# Patient Record
Sex: Female | Born: 1970 | Race: Black or African American | Hispanic: No | Marital: Married | Smoking: Never smoker
Health system: Southern US, Community
[De-identification: ages and names within clinical notes are randomized; demographics above are authoritative.]

## PROBLEM LIST (undated history)

## (undated) DIAGNOSIS — I10 Essential (primary) hypertension: Secondary | ICD-10-CM

## (undated) HISTORY — PX: OTHER SURGICAL HISTORY: SHX169

## (undated) HISTORY — PX: ABDOMINAL HYSTERECTOMY: SHX81

## (undated) HISTORY — PX: TONSILLECTOMY: SUR1361

---

## 2015-06-08 ENCOUNTER — Emergency Department
Admission: EM | Admit: 2015-06-08 | Discharge: 2015-06-08 | Disposition: A | Discharge status: Home / Self Care | Payer: BLUE CROSS/BLUE SHIELD | Attending: Emergency Medicine | Admitting: Emergency Medicine

## 2015-06-08 ENCOUNTER — Emergency Department: Payer: BLUE CROSS/BLUE SHIELD

## 2015-06-08 ENCOUNTER — Encounter: Payer: Self-pay | Admitting: Emergency Medicine

## 2015-06-08 DIAGNOSIS — I10 Essential (primary) hypertension: Secondary | ICD-10-CM | POA: Diagnosis not present

## 2015-06-08 DIAGNOSIS — E039 Hypothyroidism, unspecified: Secondary | ICD-10-CM | POA: Diagnosis not present

## 2015-06-08 DIAGNOSIS — F329 Major depressive disorder, single episode, unspecified: Secondary | ICD-10-CM | POA: Diagnosis not present

## 2015-06-08 DIAGNOSIS — R202 Paresthesia of skin: Secondary | ICD-10-CM | POA: Diagnosis not present

## 2015-06-08 DIAGNOSIS — R0602 Shortness of breath: Secondary | ICD-10-CM | POA: Diagnosis present

## 2015-06-08 DIAGNOSIS — G8929 Other chronic pain: Secondary | ICD-10-CM | POA: Diagnosis not present

## 2015-06-08 DIAGNOSIS — R059 Cough, unspecified: Secondary | ICD-10-CM

## 2015-06-08 DIAGNOSIS — R05 Cough: Secondary | ICD-10-CM | POA: Diagnosis not present

## 2015-06-08 HISTORY — DX: Essential (primary) hypertension: I10

## 2015-06-08 LAB — CBC WITH DIFFERENTIAL/PLATELET
BASOS PCT: 0 %
Basophils Absolute: 0 10*3/uL (ref 0–0.1)
EOS ABS: 0.3 10*3/uL (ref 0–0.7)
Eosinophils Relative: 5 %
HCT: 41.8 % (ref 35.0–47.0)
HEMOGLOBIN: 13.7 g/dL (ref 12.0–16.0)
Lymphocytes Relative: 36 %
Lymphs Abs: 2.3 10*3/uL (ref 1.0–3.6)
MCH: 27.3 pg (ref 26.0–34.0)
MCHC: 32.8 g/dL (ref 32.0–36.0)
MCV: 83.1 fL (ref 80.0–100.0)
MONOS PCT: 6 %
Monocytes Absolute: 0.4 10*3/uL (ref 0.2–0.9)
NEUTROS PCT: 53 %
Neutro Abs: 3.4 10*3/uL (ref 1.4–6.5)
Platelets: 241 10*3/uL (ref 150–440)
RBC: 5.02 MIL/uL (ref 3.80–5.20)
RDW: 15.1 % — ABNORMAL HIGH (ref 11.5–14.5)
WBC: 6.4 10*3/uL (ref 3.6–11.0)

## 2015-06-08 LAB — COMPREHENSIVE METABOLIC PANEL
ALBUMIN: 3.9 g/dL (ref 3.5–5.0)
ALT: 18 U/L (ref 14–54)
ANION GAP: 5 (ref 5–15)
AST: 20 U/L (ref 15–41)
Alkaline Phosphatase: 74 U/L (ref 38–126)
BUN: 12 mg/dL (ref 6–20)
CALCIUM: 9.2 mg/dL (ref 8.9–10.3)
CO2: 29 mmol/L (ref 22–32)
CREATININE: 0.74 mg/dL (ref 0.44–1.00)
Chloride: 103 mmol/L (ref 101–111)
GFR calc non Af Amer: >60 mL/min (ref >60)
Glucose, Bld: 82 mg/dL (ref 65–99)
POTASSIUM: 3.1 mmol/L — AB (ref 3.5–5.1)
SODIUM: 137 mmol/L (ref 135–145)
TOTAL PROTEIN: 7.4 g/dL (ref 6.5–8.1)
Total Bilirubin: 0.5 mg/dL (ref 0.3–1.2)

## 2015-06-08 LAB — PROTIME-INR
INR: 0.98
PROTHROMBIN TIME: 13.2 s (ref 11.4–15.0)

## 2015-06-08 LAB — APTT: APTT: 30 s (ref 24–36)

## 2015-06-08 LAB — TROPONIN I

## 2015-06-08 MED ORDER — SODIUM CHLORIDE 0.9 % IV BOLUS (SEPSIS)
500.0000 mL | Freq: Once | INTRAVENOUS | Status: AC
  Administered 2015-06-08: 500 mL via INTRAVENOUS

## 2015-06-08 MED ORDER — BENZONATATE 100 MG PO CAPS: 100.0000 mg | ORAL_CAPSULE | Freq: Three times a day (TID) | ORAL | Status: AC | PRN

## 2015-06-08 MED ORDER — IOPAMIDOL (ISOVUE-300) INJECTION 61%
75.0000 mL | Freq: Once | INTRAVENOUS | Status: AC | PRN
  Administered 2015-06-08: 75 mL via INTRAVENOUS

## 2015-06-08 MED ORDER — DIPHENHYDRAMINE HCL 50 MG/ML IJ SOLN
12.5000 mg | Freq: Once | INTRAMUSCULAR | Status: DC
  Filled 2015-06-08: qty 1

## 2015-06-08 MED ORDER — DIPHENHYDRAMINE HCL 25 MG PO CAPS
25.0000 mg | ORAL_CAPSULE | Freq: Once | ORAL | Status: AC
  Administered 2015-06-08: 25 mg via ORAL
  Filled 2015-06-08: qty 1

## 2015-06-08 NOTE — ED Notes (Signed)
Code stroke canceled  

## 2015-06-08 NOTE — ED Provider Notes (Signed)
-----------------------------------------   4:25 PM on 06/08/2015 -----------------------------------------   Blood pressure 119/87, pulse 90, temperature 97.9 F (36.6 C), temperature source Oral, resp. rate 16, height 5\' 1"  (1.549 m), weight 189 lb (85.73 kg), SpO2 98 %.  Assuming care from Dr. Inocencio HomesGayle.  In short, Ashley Noble is a 45 y.o. female with a chief complaint of Shortness of Breath .  Refer to the original H&P for additional details.  The current plan of care is to follow results of the patient's CT of the neck which appears to be negative for any inflammatory changes. The patient apparently presented with some very vague complaints of cough and paresthesias. Patient was seen by tele-neurology was felt not to be having acute neurologic emergency at this time. Patient had extensive tests including head CT, chest x-ray, CT of the neck and laboratory work. Her potassium is slightly low and I discussed this with the patient at the bedside and informed her of the results of her studies. Patient was discharged on Tessalon Perles and advised to follow up with her primary physician for further outpatient evaluation but felt that there was not a reason to admit the patient at this time.   Jennye MoccasinBrian S Quigley, MD 06/08/15 [phone]

## 2015-06-08 NOTE — ED Notes (Signed)
Pt c/o numbness and tingling starting now to left arm.

## 2015-06-08 NOTE — Discharge Instructions (Signed)
Cough, Adult Coughing is a reflex that clears your throat and your airways. Coughing helps to heal and protect your lungs. It is normal to cough occasionally, but a cough that happens with other symptoms or lasts a long time may be a sign of a condition that needs treatment. A cough may last only 2-3 weeks (acute), or it may last longer than 8 weeks (chronic). CAUSES Coughing is commonly caused by:  Breathing in substances that irritate your lungs.  A viral or bacterial respiratory infection.  Allergies.  Asthma.  Postnasal drip.  Smoking.  Acid backing up from the stomach into the esophagus (gastroesophageal reflux).  Certain medicines.  Chronic lung problems, including COPD (or rarely, lung cancer).  Other medical conditions such as heart failure. HOME CARE INSTRUCTIONS  Pay attention to any changes in your symptoms. Take these actions to help with your discomfort:  Take medicines only as told by your health care provider.  If you were prescribed an antibiotic medicine, take it as told by your health care provider. Do not stop taking the antibiotic even if you start to feel better.  Talk with your health care provider before you take a cough suppressant medicine.  Drink enough fluid to keep your urine clear or pale yellow.  If the air is dry, use a cold steam vaporizer or humidifier in your bedroom or your home to help loosen secretions.  Avoid anything that causes you to cough at work or at home.  If your cough is worse at night, try sleeping in a semi-upright position.  Avoid cigarette smoke. If you smoke, quit smoking. If you need help quitting, ask your health care provider.  Avoid caffeine.  Avoid alcohol.  Rest as needed. SEEK MEDICAL CARE IF:   You have new symptoms.  You cough up pus.  Your cough does not get better after 2-3 weeks, or your cough gets worse.  You cannot control your cough with suppressant medicines and you are losing sleep.  You  develop pain that is getting worse or pain that is not controlled with pain medicines.  You have a fever.  You have unexplained weight loss.  You have night sweats. SEEK IMMEDIATE MEDICAL CARE IF:  You cough up blood.  You have difficulty breathing.  Your heartbeat is very fast.   This information is not intended to replace advice given to you by your health care provider. Make sure you discuss any questions you have with your health care provider.   Document Released: 08/21/2010 Document Revised: 11/13/2014 Document Reviewed: 05/01/2014 Elsevier Interactive Patient Education [employer]2016 [employer]  Please return immediately if condition worsens. Please contact her primary physician or the physician you were given for referral. If you have any specialist physicians involved in her treatment and plan please also contact them. Thank you for using [facility].

## 2015-06-08 NOTE — ED Provider Notes (Signed)
[facility] Emergency Department Provider Note  ____________________________________________  Time seen: Approximately 1:56 PM  I have reviewed the triage vital signs and the nursing notes.   HISTORY  Chief Complaint Shortness of Breath    HPI Ashley Noble is a 45 y.o. 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 She feels mildly short of breath but feels this is related to her throat. No chest pain. No exertional dyspnea. This morning at approximately 11:30 AM she also noted that she was having some tingling in the left anterior neck just under the mandible. During her triage assessment she reports that she began developing tingling in the left hand and a code stroke was initiated. She denies any fevers or chills. No vomiting or diarrhea, no abdominal pain.   Past Medical History  Diagnosis Date  . Hypertension     There are no active problems to display for this patient.   Past Surgical History  Procedure Laterality Date  . Tonsillectomy    . Abdominal hysterectomy    . Vocal cord surgery      No current outpatient prescriptions on file.  Allergies Morphine and related  History reviewed. No pertinent family history.  Social History Social History  Substance Use Topics  . Smoking status: Never Smoker   . Smokeless tobacco: None  . Alcohol Use: No    Review of Systems Constitutional: No fever/chills Eyes: No visual changes. ENT: + sore throat. Cardiovascular: Denies chest pain. Respiratory: + shortness of breath. Gastrointestinal: No abdominal pain.  No nausea, no vomiting.  No  diarrhea.  No constipation. Genitourinary: Negative for dysuria. Musculoskeletal: Negative for back pain. Skin: Negative for rash. Neurological: Negative for headaches, focal weakness or numbness.  10-point ROS otherwise negative.  ____________________________________________   PHYSICAL EXAM:  VITAL SIGNS: ED Triage Vitals  Enc Vitals Group     BP 06/08/15 1323 126/86 mmHg     Pulse Rate 06/08/15 1323 96     Resp 06/08/15 1323 18     Temp 06/08/15 1323 98.2 F (36.8 C)     Temp Source 06/08/15 1323 Oral     SpO2 06/08/15 1323 95 %     Weight 06/08/15 1323 189 lb (85.73 kg)     Height 06/08/15 1323  (1.549 m)     Head Cir --      Peak Flow --      Pain Score --      Pain Loc --      Pain Edu? --      Excl. in GC? --     Constitutional: Alert and oriented. Well appearing and in no acute distress.Frequent dry cough. Speaking with chronically hoarse voice. Eyes: Conjunctivae are normal. PERRL. EOMI. Head: Atraumatic. Nose: No congestion/rhinnorhea. Mouth/Throat: Mucous membranes are moist.  Oropharynx non-erythematous. No tonsillar deviation, no peritonsillar abscess, no drooling. She is handling all secretions well. Neck: No stridor. Supple without meningismus. Cardiovascular: Normal rate, regular rhythm. Grossly normal heart sounds.  Good peripheral circulation. Respiratory: Normal respiratory effort.  No retractions. Lungs CTAB. Gastrointestinal: Soft and nontender. No distention.  No CVA tenderness. Genitourinary: deferred Musculoskeletal: No lower extremity tenderness nor edema.  No joint effusions.  Neurologic:  Normal speech and language without word finding difficulty or dysarthria. 5 out of 5 strength in bilateral upper and lower extremities. Decreased sensation to light touch in the mid and lower face and cranial nerve V1, V2 distribution. Decreased sensation to light touch in the left arm distal to the elbow but proximal to the elbow there is no difference in  sensation between the left and right arm. Sensation intact to light touch in the right arm, bilateral lower extremities. Cranial nerves II through XII otherwise intact. Skin:  Skin is warm, dry and intact. No rash noted. Psychiatric: Mood and affect are normal. Speech and behavior are normal.  ____________________________________________   LABS (all labs ordered are listed, but only abnormal results are displayed)  Labs Reviewed  COMPREHENSIVE METABOLIC PANEL - Abnormal; Notable for the following:    Potassium 3.1 (*)    All other components within normal limits  CBC WITH DIFFERENTIAL/PLATELET - Abnormal; Notable for the following:    RDW 15.1 (*)    All other components within normal limits  PROTIME-INR  APTT  TROPONIN I  CBG MONITORING, ED   ____________________________________________  EKG  ED ECG REPORT I, Gayla Doss, the attending physician, personally viewed and interpreted this ECG.   Date: 06/08/2015  EKG Time: 13:50  Rate: 79  Rhythm: normal EKG, normal sinus rhythm  Axis: normal  Intervals:none  ST&T Change: No acute ST elevation.  ____________________________________________  RADIOLOGY  CT head IMPRESSION: No evidence of intracranial abnormality.  Critical Value/emergent results were called by telephone at the time of interpretation on 06/08/2015 at 1:41 pm to Dr. Toney Rakes , who verbally acknowledged these results. ____________________________________________   PROCEDURES  Procedure(s) performed: None  Critical Care performed: No  ____________________________________________   INITIAL IMPRESSION / ASSESSMENT AND PLAN / ED COURSE  Pertinent labs & imaging results that were available during my care of the patient were reviewed by me and considered in my medical decision making (see chart for details).  Ashley Noble is a 45 y.o. 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 On exam she is well-appearing and in no acute distress. Vital signs stable, she is afebrile. She is speaking with a chronically hoarse voice which was noted in her prior [facility] ENT records. She has an intact strength exam. She has mildly decreased sensation in the left arm distal to the elbow as well as in the V1 and V2 distribution of the left face. Code stroke was initiated on her arrival, CT head negative. Dr. Rosezella Rumpf of neurology has evaluated the patient recommends that we do not administer tPa as this does not appear consistent with stroke and may be a migraine variant. Dr. Rosezella Rumpf does not recommend MRI of the brain.We'll obtain chest x-ray to further evaluate her cough, obtain screening labs and reassess for disposition Of note, she does not have any evidence to suggest impending airway compromise though she feels as if her throat is swelling. From what I can tell, she is most concerned about her cough and sense of throat swelling. We'll give give Benadryl. Please see the excerpt from her ENT note dated on 02/18/2015   "02/18/15: patient is back for follow up for procedure on 02/07/2015.  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 She underwent trial augmentation on the left with gelfoam. She has been persistently hoarse since surgery. There was some improvement in the last few days until she got a URI from a family member."  ----------------------------------------- 3:55 PM on 06/08/2015 ----------------------------------------- Patient continues to appear well and is in no acute distress. CBC unremarkable. Negative troponin. CMP with mild hypokalemia, potassium 3.1. Normal coags. Chest x-ray clear. CT head negative. We'll obtain CT soft  tissue neck given her history of vocal cord surgeries, sense of throat swelling though there is no clinical evidence of this at this time. Care is referred to Dr. Huel CoteQuigley pending CT neck, reassessment, final disposition.   ____________________________________________   FINAL CLINICAL IMPRESSION(S) / ED DIAGNOSES  Final diagnoses:  Cough  Paresthesia      Gayla DossEryka A Faust Thorington, MD 06/08/15 1557

## 2015-06-08 NOTE — ED Notes (Addendum)
Pt c/o feeling like throat is swelling this morning.  Also reports tingling on left side of face.  No facial droop, no weakness, voice whispered but clear; voice always low r/t vocal cord surgery in past.  Reports SHOB. No respiratory distress noted. Has had productive cough.  Controlling saliva, no drooling.  Has had cough for 2 weeks

## 2017-08-01 IMAGING — CT CT NECK W/ CM
2 of 3 series · 8 of 14 positions shown, 9 images · IV contrast (iopamidol)
Comparison: None.

CLINICAL DATA: Feels like throat is swelling this morning, tingling
LEFT side of face, shortness of breath, remote vocal cord surgery,
productive cough, perceives sweating, coughing for 2 weeks

EXAM:
CT NECK WITH CONTRAST
TECHNIQUE: Multidetector CT imaging of the neck was performed using the
standard protocol following the bolus administration of intravenous
contrast.
CONTRAST:  75mL [id number] IOPAMIDOL ([id number]) INJECTION 61%

[Series 2: axial neck · axial · 0.50mm/px · z∈[-268,-130]mm · 4 of 115 slices shown]
[im 23/115  bone]
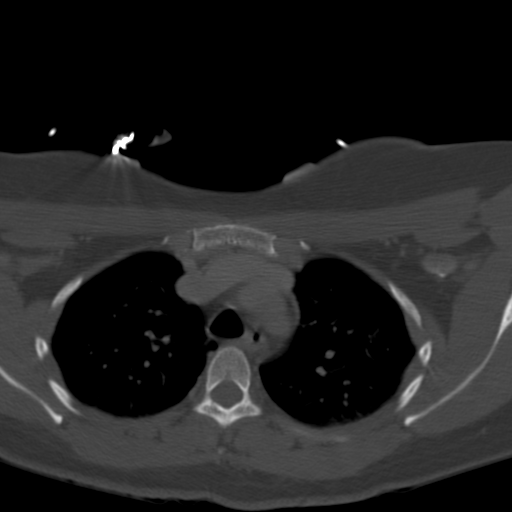
[im 46/115  bone]
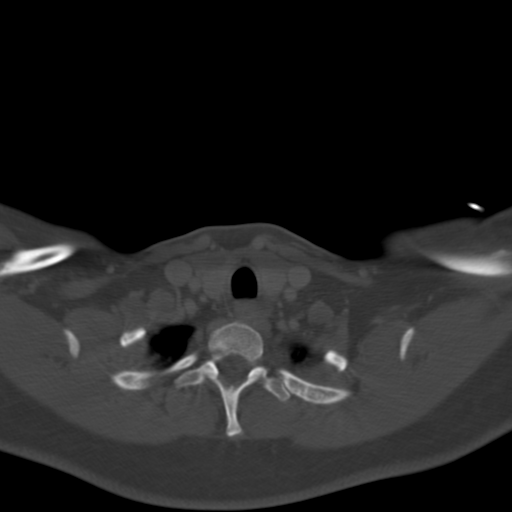
[im 69/115  bone]
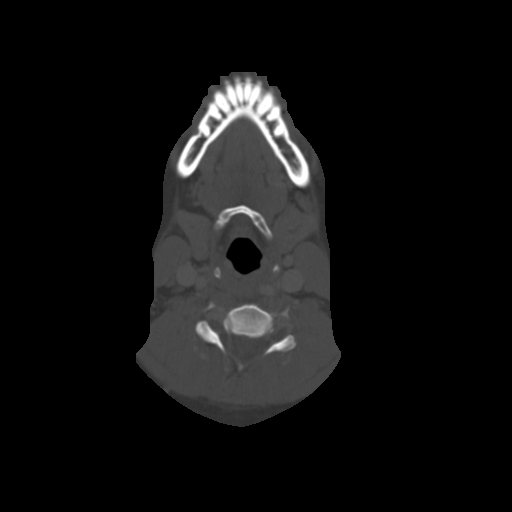
[im 92/115  bone]
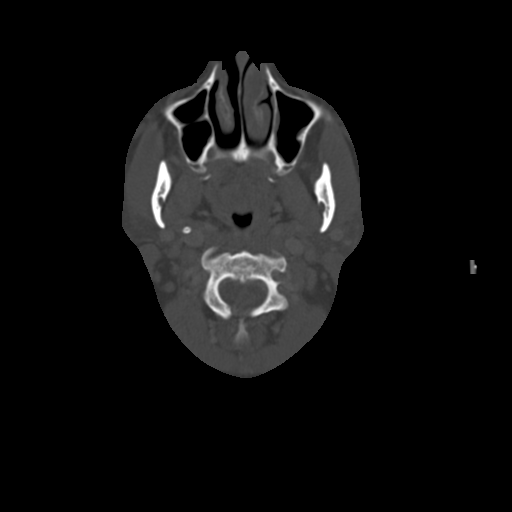

[Series 7: ax oropharynx · axial · 0.33mm/px · z∈[-293,-151]mm · 4 of 127 slices shown, 5 images]
[im 26/127  soft-tissue]
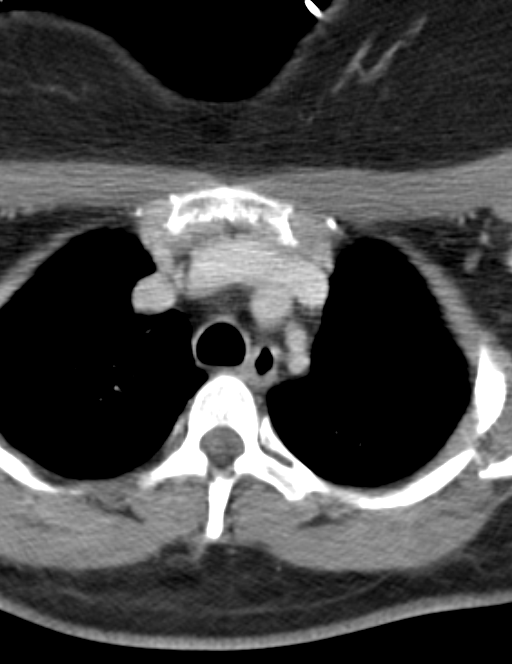
[im 26/127  bone]
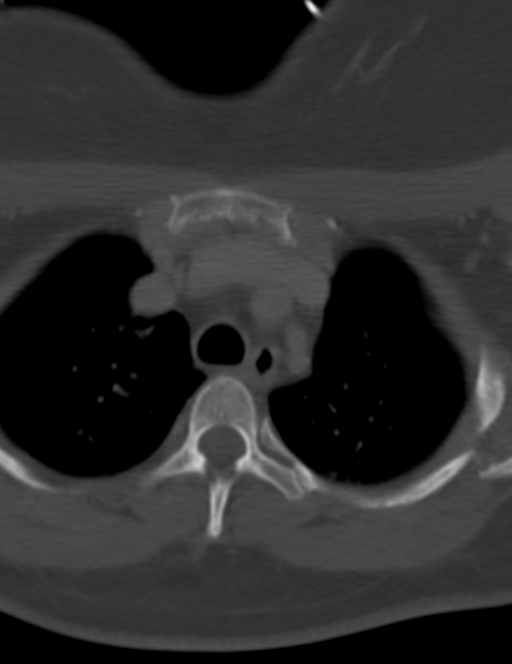
[im 51/127  bone]
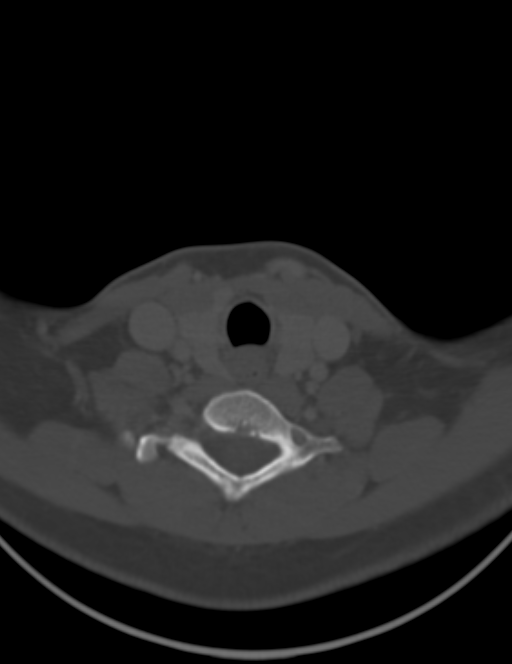
[im 76/127  bone]
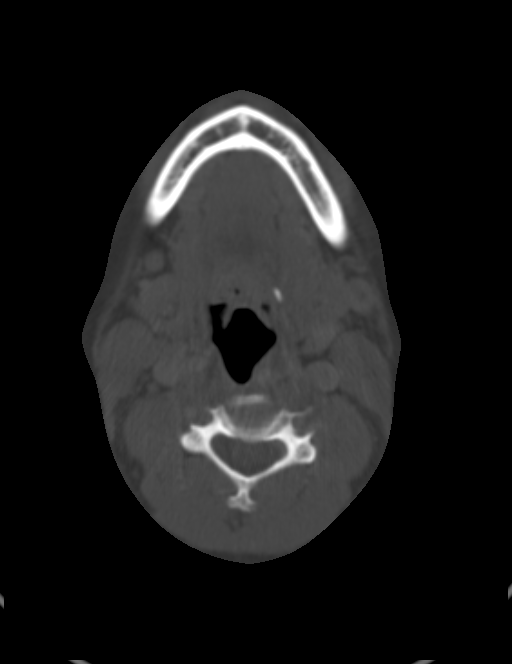
[im 101/127  bone]
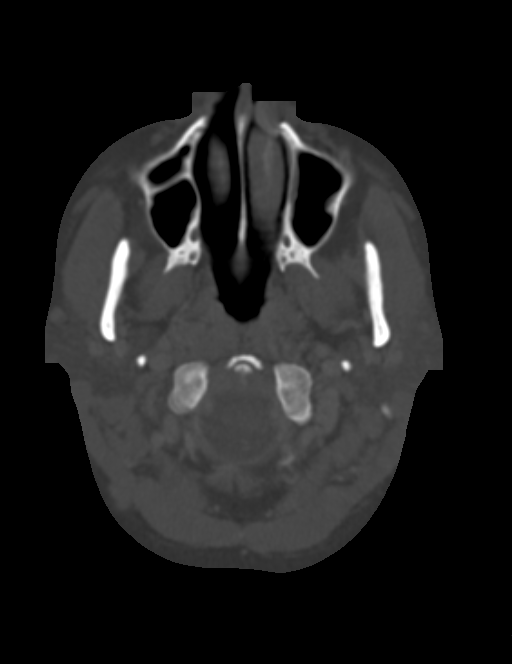

[8 of 14 positions shown; findings below may reference images not displayed]

FINDINGS: Scattered beam hardening artifacts of dental origin.

Pharynx and larynx: Prevertebral soft tissues normal thickness.
Laryngeal airway patent with normal appearance of trachea to
bifurcation. Piriform sinuses normal appearance. No significant
airway narrowing identified. Parapharyngeal spaces clear. No
regional inflammatory process or parapharyngeal abscess identified.

Salivary glands: Normal appearing parotid and submandibular glands

Thyroid: Normal appearance

Lymph nodes: Scattered normal sized cervical lymph nodes
bilaterally.

Vascular: Vascular structures patent.

Limited intracranial: No focal abnormalities identified

Visualized orbits: Normal appearance

Mastoids and visualized paranasal sinuses: Normal appearance, clear

Skeleton: Normal appearance

Upper chest: Normal appearance
IMPRESSION: No significant soft tissue abnormalities of the neck identified.

## 2022-07-20 DIAGNOSIS — Z1231 Encounter for screening mammogram for malignant neoplasm of breast: Secondary | ICD-10-CM | POA: Diagnosis not present

## 2022-07-20 DIAGNOSIS — R197 Diarrhea, unspecified: Secondary | ICD-10-CM | POA: Diagnosis not present

## 2022-07-20 DIAGNOSIS — Z23 Encounter for immunization: Secondary | ICD-10-CM | POA: Diagnosis not present

## 2022-07-20 DIAGNOSIS — R7303 Prediabetes: Secondary | ICD-10-CM | POA: Diagnosis not present

## 2022-07-20 DIAGNOSIS — G4733 Obstructive sleep apnea (adult) (pediatric): Secondary | ICD-10-CM | POA: Diagnosis not present

## 2022-07-20 DIAGNOSIS — F331 Major depressive disorder, recurrent, moderate: Secondary | ICD-10-CM | POA: Diagnosis not present

## 2022-07-20 DIAGNOSIS — I10 Essential (primary) hypertension: Secondary | ICD-10-CM | POA: Diagnosis not present
